# Patient Record
Sex: Female | Born: 2000 | Race: White | Hispanic: No | Marital: Single | Smoking: Never smoker
Health system: Southern US, Community
[De-identification: ages and names within clinical notes are randomized; demographics above are authoritative.]

---
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
## 2019-01-01 LAB — NOVEL CORONAVIRUS, NAA: SARS-CoV-2, NAA: NOT DETECTED

## 2019-03-09 IMAGING — US US TRANSVAGINAL NON-OB
1 series · 14 of 25 positions shown · non-contrast
Comparison: None

CLINICAL DATA: Generalized pelvic pain for 6 months

EXAM:
TRANSABDOMINAL AND TRANSVAGINAL ULTRASOUND OF PELVIS
TECHNIQUE: Both transabdominal and transvaginal ultrasound examinations of the
pelvis were performed. Transabdominal technique was performed for
global imaging of the pelvis including uterus, ovaries, adnexal
regions, and pelvic cul-de-sac. It was necessary to proceed with
endovaginal exam following the transabdominal exam to visualize the
endometrium and ovaries.

[Series 1: us transvaginal non-ob · 0.18mm/px · 14 of 48 slices shown]
[im 1/48]
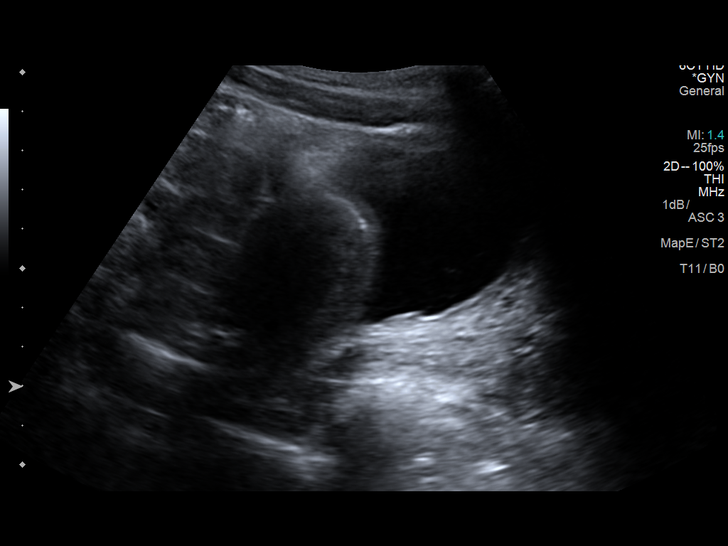
[im 4/48]
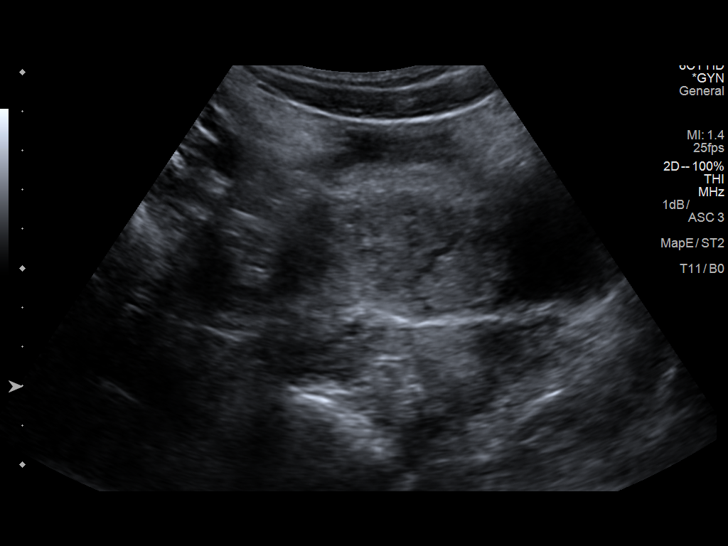
[im 8/48]
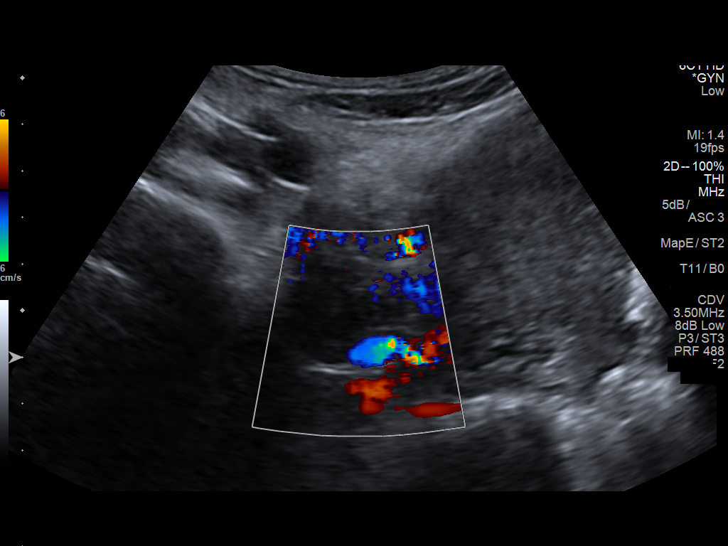
[im 12/48]
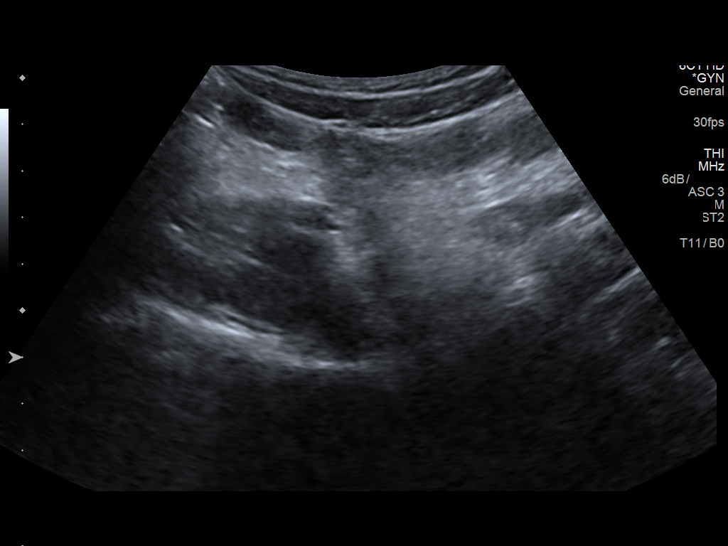
[im 16/48]
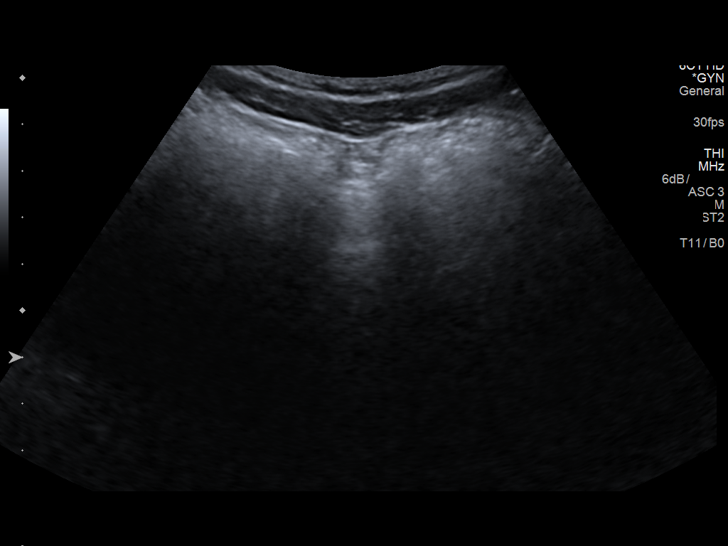
[im 18/48]
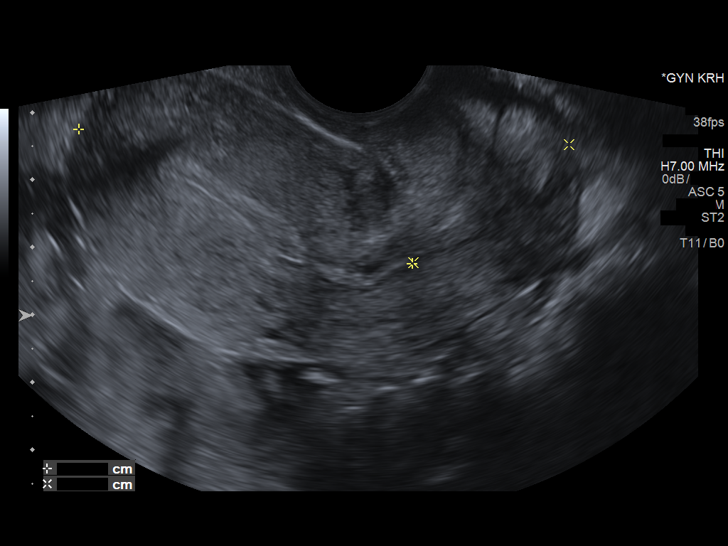
[im 22/48]
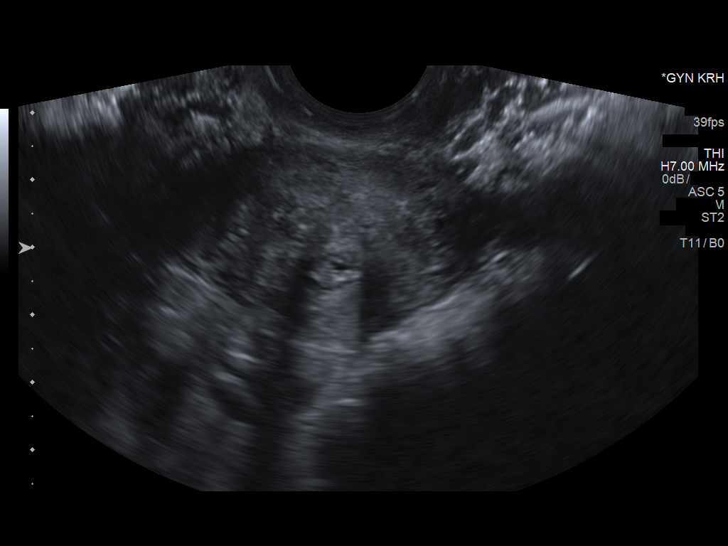
[im 26/48]
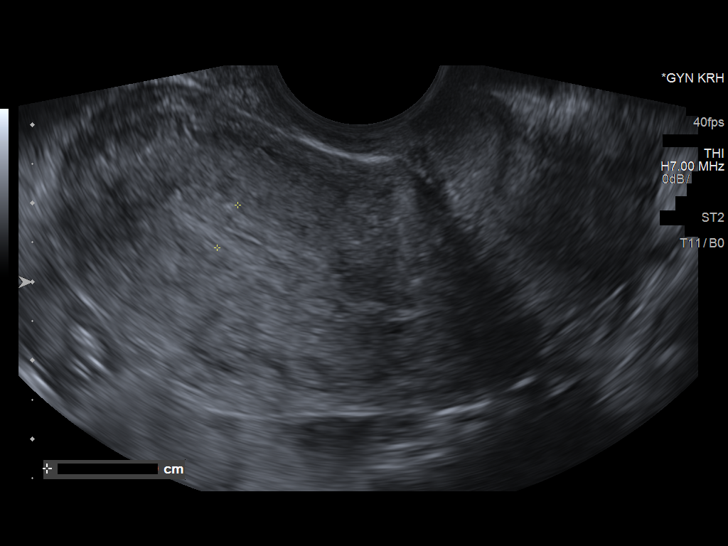
[im 30/48]
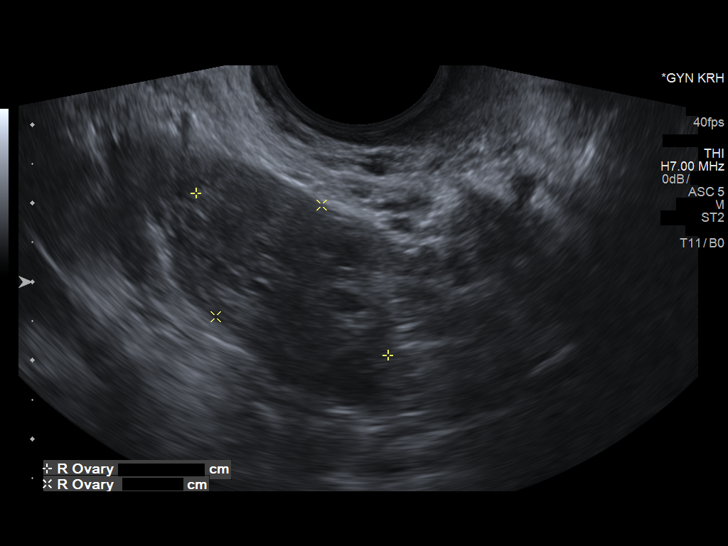
[im 32/48]
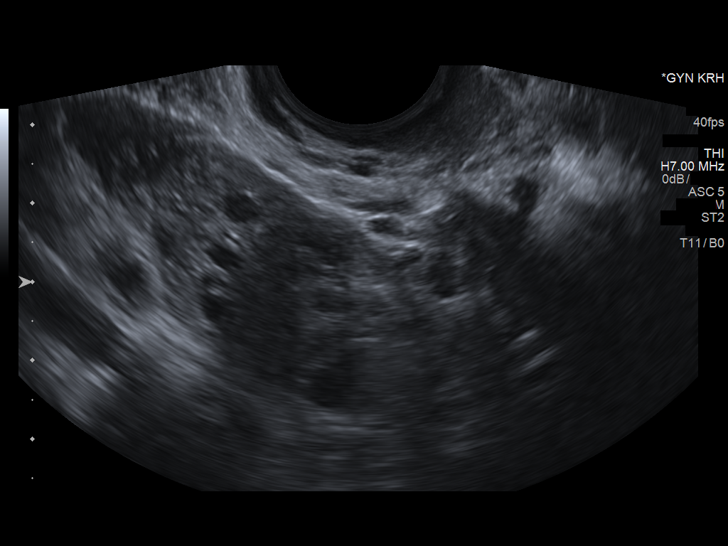
[im 36/48]
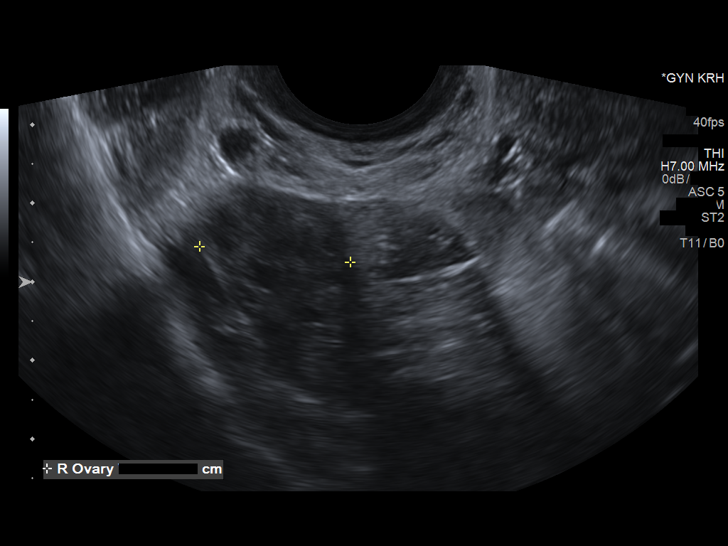
[im 40/48]
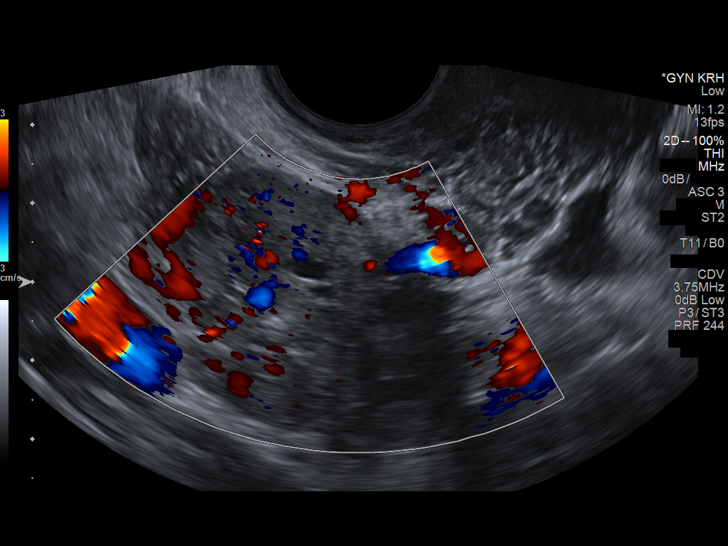
[im 44/48]
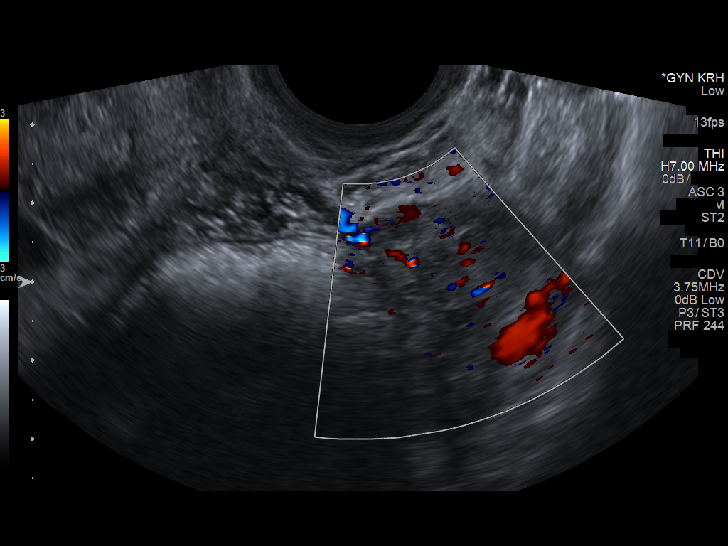
[im 48/48]
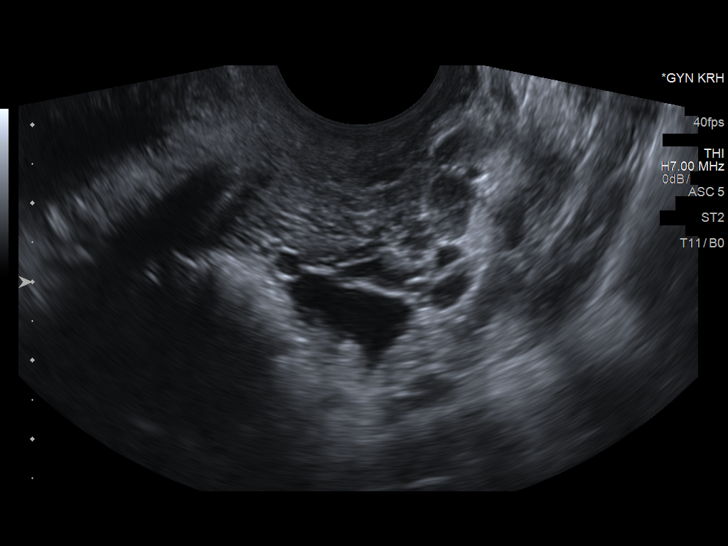

[14 of 25 positions shown; findings below may reference images not displayed]

FINDINGS: Uterus

Measurements: 8.2 x 3.4 x 5.6 cm. No fibroids or other mass
visualized.

Endometrium

Thickness: 6 mm.  No focal abnormality visualized.

Right ovary

Measurements: 3.2 x 2 x 1.9 cm.  Normal appearance/no adnexal mass.

Left ovary

Measurements: 3.8 x 2.7 x 2.1 cm. Normal appearance/no adnexal mass.

Other findings

Trace physiologic fluid in the pelvis.
IMPRESSION: Normal study.
